# Patient Record
Sex: Male | Born: 2000 | Race: White | Hispanic: No | Marital: Single | Smoking: Never smoker
Health system: Southern US, Community
[De-identification: ages and names within clinical notes are randomized; demographics above are authoritative.]

---
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
## 2021-06-01 IMAGING — US US RENAL
1 series · 14 of 25 positions shown · non-contrast
Comparison: None.

CLINICAL DATA: Flank pain

EXAM:
RENAL / URINARY TRACT ULTRASOUND COMPLETE

[Series 1: us renal · 14 of 37 slices shown]
[im 1/37]
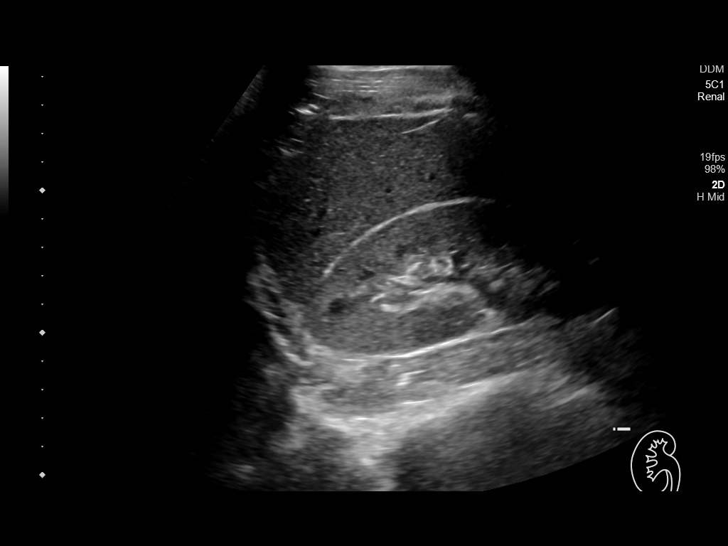
[im 4/37]
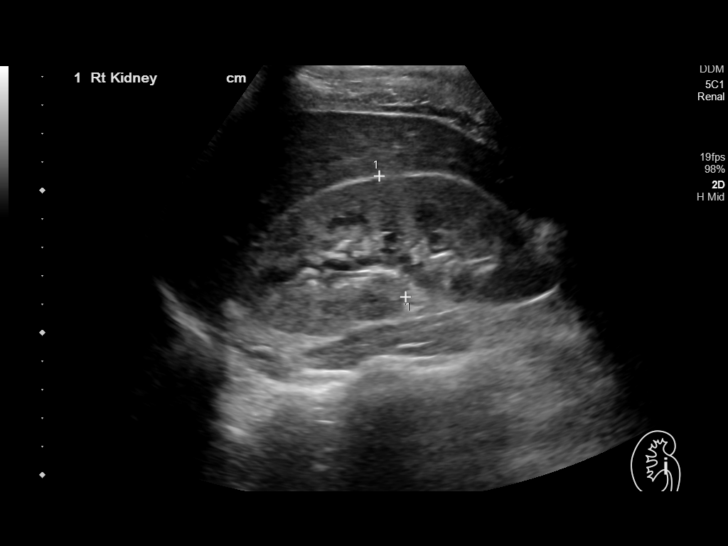
[im 7/37]
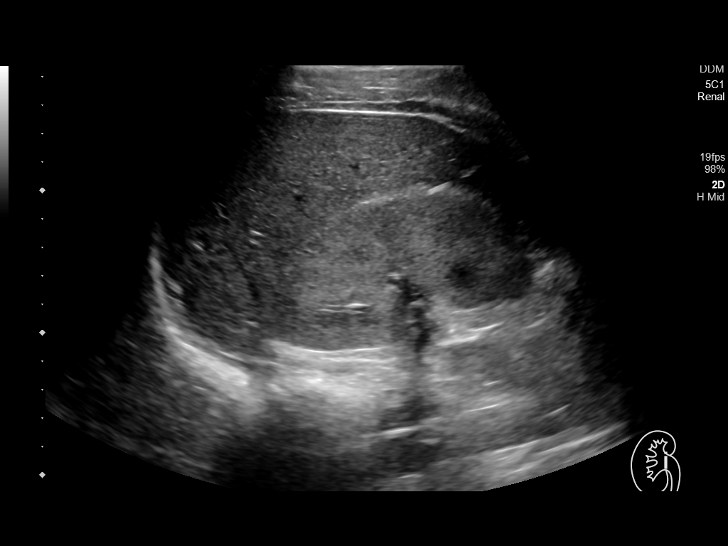
[im 10/37]
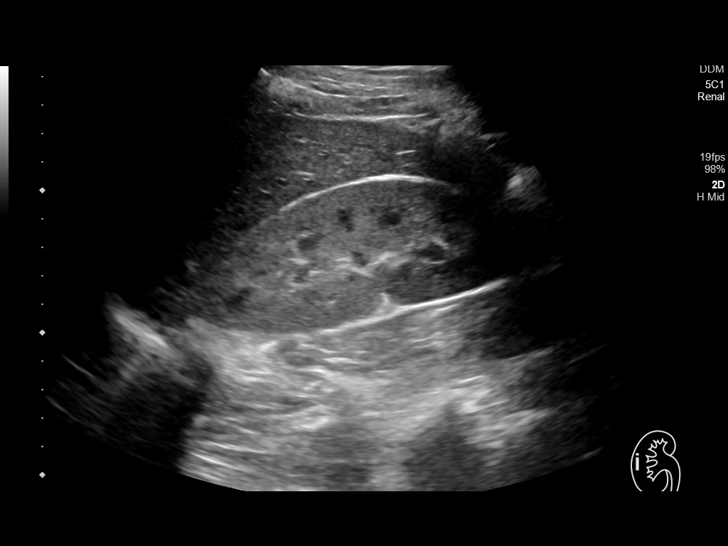
[im 13/37]
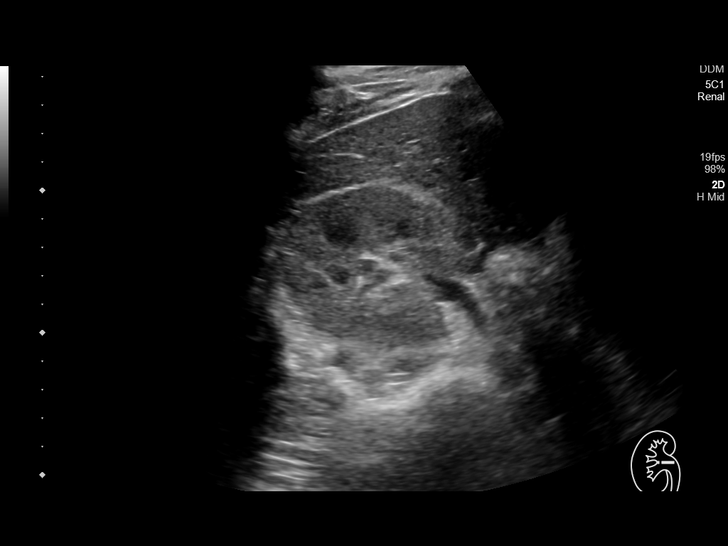
[im 14/37]
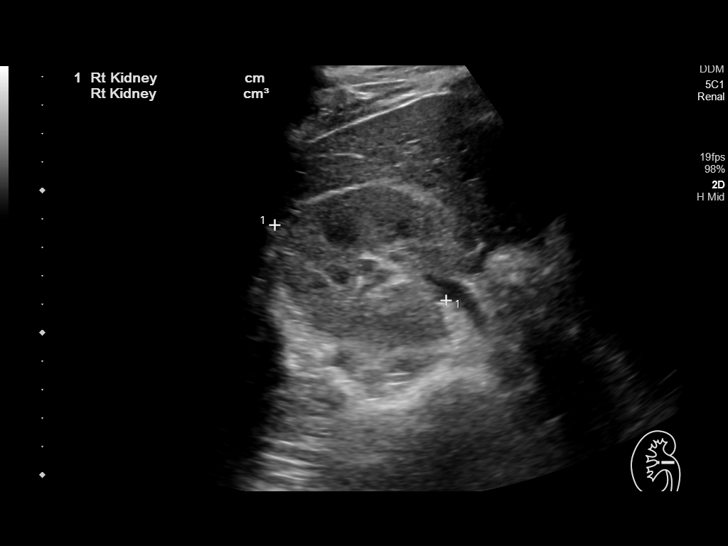
[im 17/37]
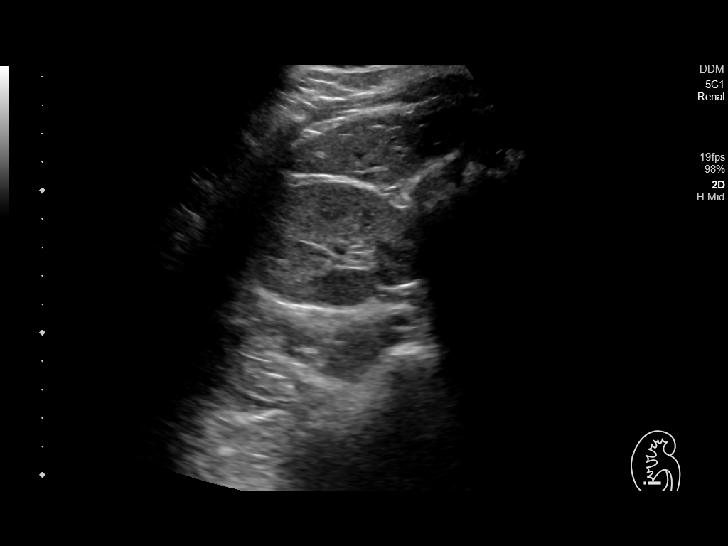
[im 20/37]
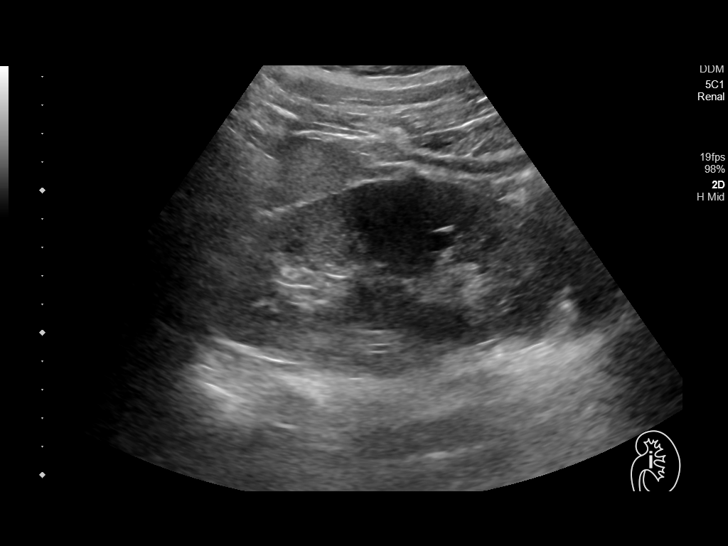
[im 23/37]
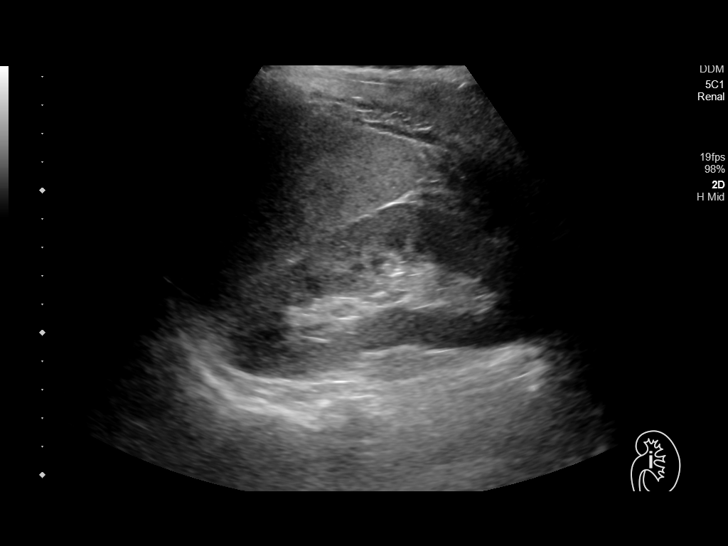
[im 25/37]
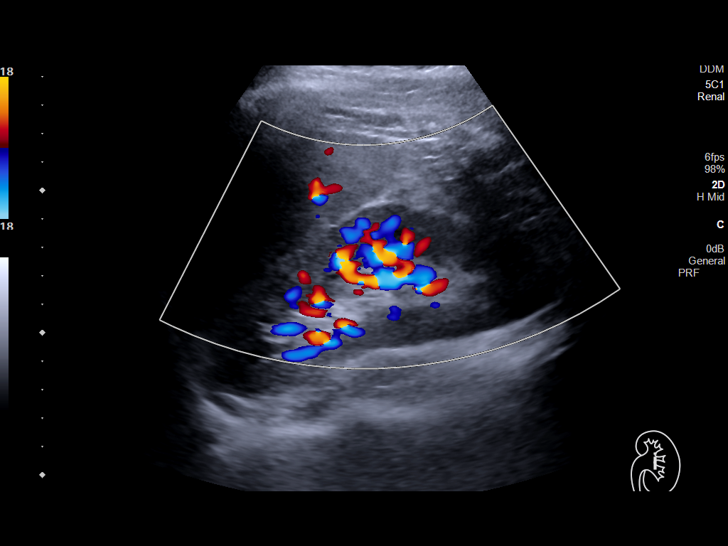
[im 28/37]
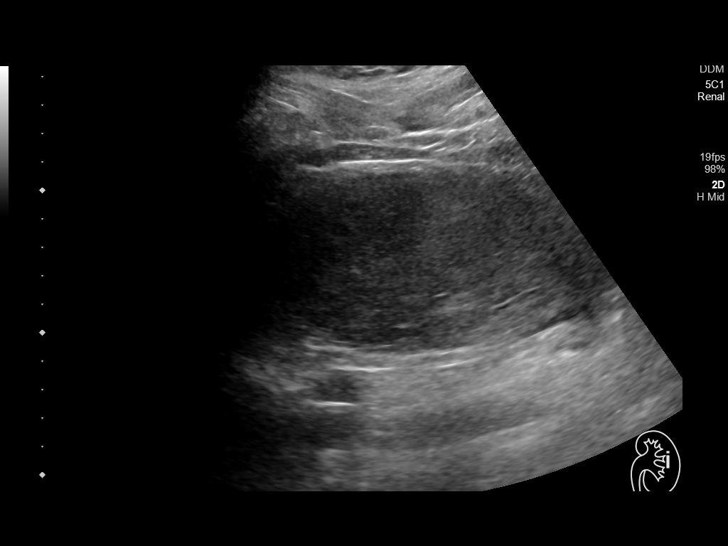
[im 31/37]
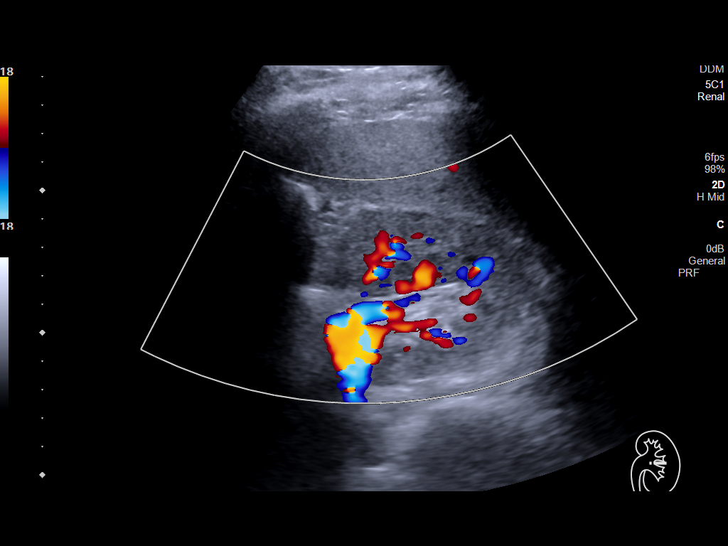
[im 34/37]
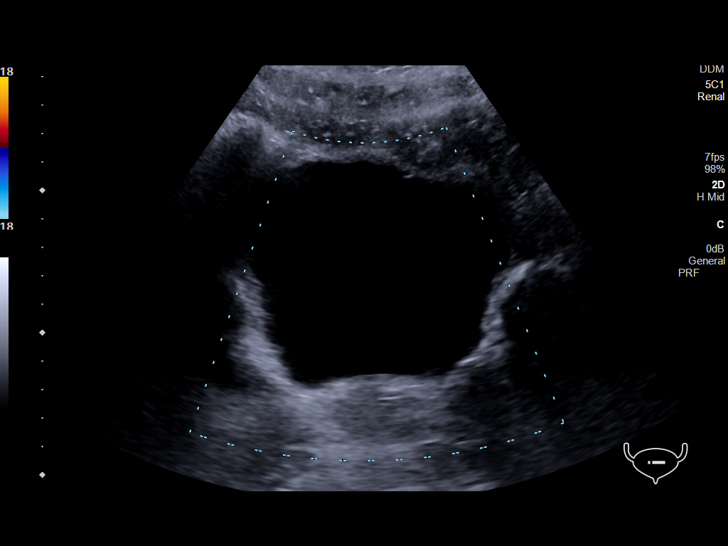
[im 37/37]
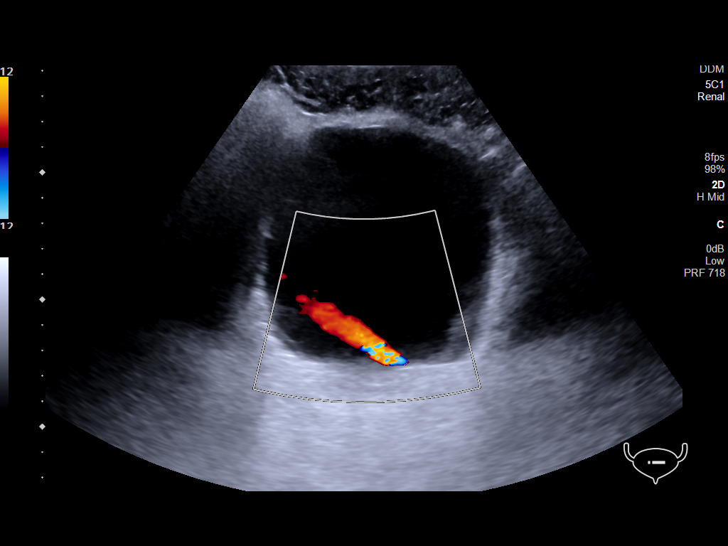

[14 of 25 positions shown; findings below may reference images not displayed]

FINDINGS: Right Kidney:

Renal measurements: 11.4 x 4.4 x 6.6 cm = volume: 170 mL .
Echogenicity within normal limits. No mass or hydronephrosis
visualized.

Left Kidney:

Renal measurements: 13.1 x 6.1 x 7.0 cm = volume: 294 mL.
Echogenicity within normal limits. No mass or hydronephrosis
visualized.

Bladder:

Appears normal for degree of bladder distention. Bilateral ureteral
jets visualized.

Other:

None.
IMPRESSION: Negative for obstructive uropathy.
